# Patient Record
Sex: Male | Born: 1985 | Race: Black or African American | Hispanic: No | Marital: Single | Smoking: Never smoker
Health system: Southern US, Community
[De-identification: ages and names within clinical notes are randomized; demographics above are authoritative.]

## PROBLEM LIST (undated history)

## (undated) DIAGNOSIS — Z8619 Personal history of other infectious and parasitic diseases: Secondary | ICD-10-CM

## (undated) HISTORY — PX: KNEE ARTHROSCOPY: SUR90

## (undated) HISTORY — DX: Personal history of other infectious and parasitic diseases: Z86.19

---

## 2004-10-23 HISTORY — PX: KNEE SURGERY: SHX244

## 2008-02-17 ENCOUNTER — Emergency Department (HOSPITAL_COMMUNITY): Admission: EM | Admit: 2008-02-17 | Discharge: 2008-02-17 | Payer: Self-pay | Admitting: Emergency Medicine

## 2011-02-08 ENCOUNTER — Emergency Department: Payer: Self-pay | Admitting: Emergency Medicine

## 2012-07-17 IMAGING — CR DG CHEST 2V
1 series · 2 of 2 positions shown · non-contrast
Comparison: none

REASON FOR EXAM: CP
COMMENTS:

PROCEDURE:     DXR - DXR CHEST PA (OR AP) AND LATERAL  - February 08, 2011  [DATE]
RESULT:     The lungs are mildly hypoinflated. There is no focal infiltrate.
Interstitial markings are mildly increased. The cardiac silhouette is normal
in size. There is no pleural effusion.

[Series 1: view not recorded · 0.17mm/px · 2 of 2 slices shown]
[im 1/2]
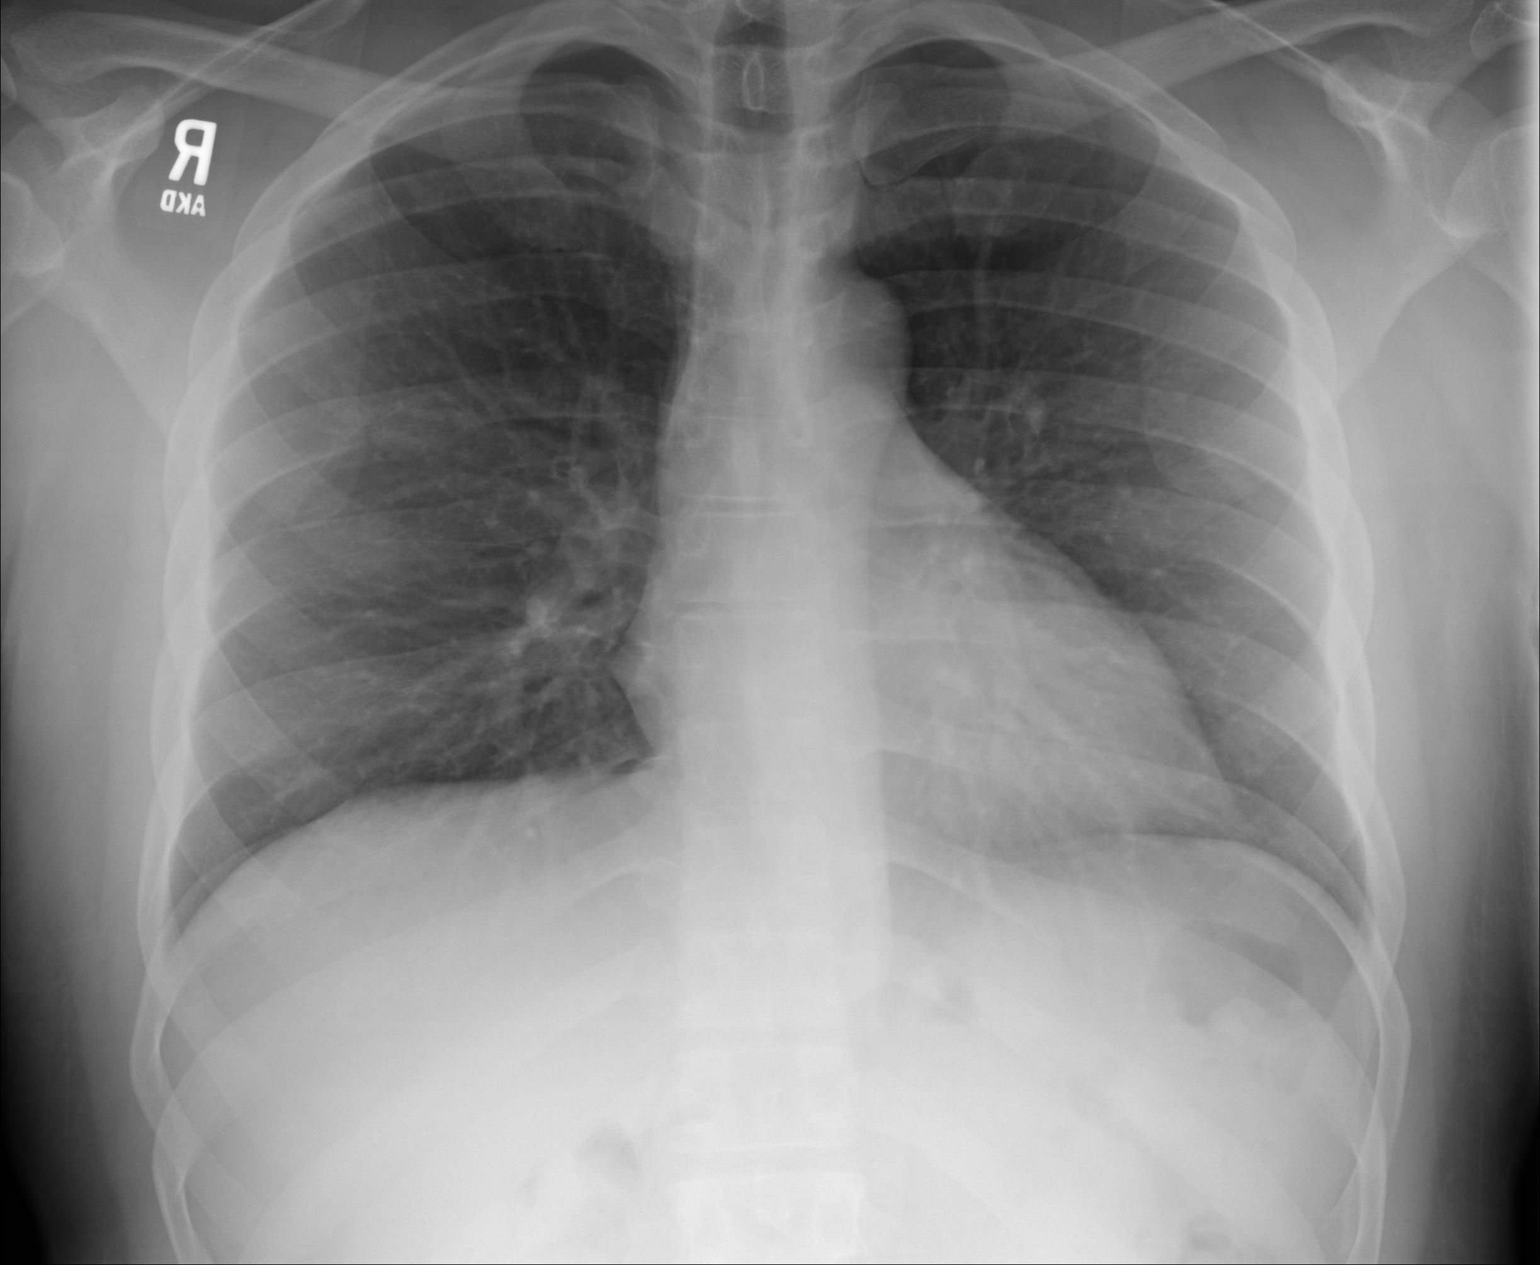
[im 2/2]
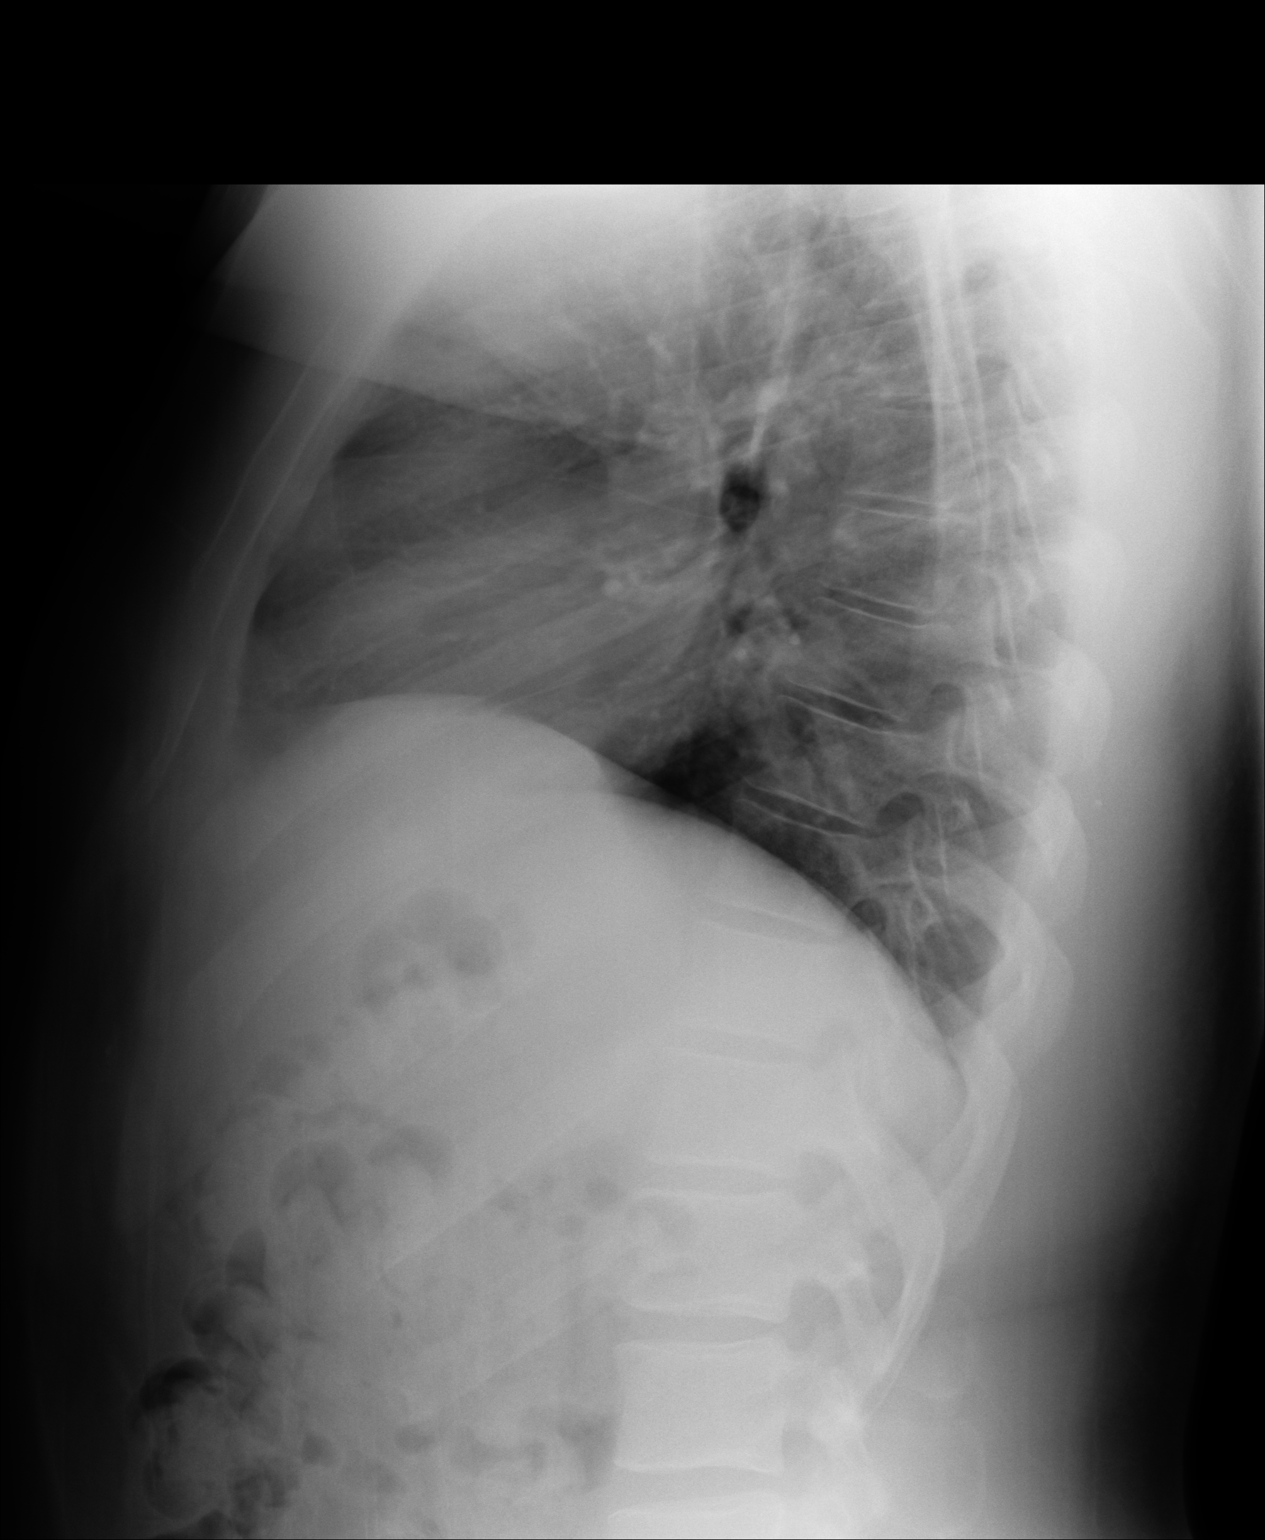

[2 of 2 positions shown; findings below may reference images not displayed]

IMPRESSION: The study is limited due to hypoinflation. Mild prominence
of the pulmonary interstitium is noted which could reflect minimal
subsegmental atelectasis or interstitial edema. I see no alveolar pneumonia.
Followup films following any therapy are recommended if the patient's
symptoms persist.

## 2014-01-26 ENCOUNTER — Emergency Department: Payer: Self-pay | Admitting: Emergency Medicine

## 2015-02-25 ENCOUNTER — Ambulatory Visit
Admission: EM | Admit: 2015-02-25 | Discharge: 2015-02-25 | Disposition: A | Discharge status: Home / Self Care | Payer: BLUE CROSS/BLUE SHIELD | Attending: Internal Medicine | Admitting: Internal Medicine

## 2015-02-25 DIAGNOSIS — H1012 Acute atopic conjunctivitis, left eye: Secondary | ICD-10-CM | POA: Diagnosis not present

## 2015-02-25 DIAGNOSIS — J019 Acute sinusitis, unspecified: Secondary | ICD-10-CM

## 2015-02-25 MED ORDER — PREDNISONE 50 MG PO TABS: 50.0000 mg | ORAL_TABLET | Freq: Every day | ORAL | Status: AC

## 2015-02-25 MED ORDER — CEFDINIR 300 MG PO CAPS: 300.0000 mg | ORAL_CAPSULE | Freq: Two times a day (BID) | ORAL | Status: DC

## 2015-02-25 MED ORDER — SULFACETAMIDE SODIUM 10 % OP SOLN: 1.0000 [drp] | OPHTHALMIC | Status: AC

## 2015-02-25 NOTE — ED Notes (Signed)
Woke this am with left eye crusted shut and itching. + redness. Visusal Accuity Right eye 20/40 and Left 20/50

## 2015-02-25 NOTE — ED Provider Notes (Signed)
CSN: [id number]     Arrival date & time 02/25/15  0755 History   None    Chief Complaint  Patient presents with  . Conjunctivitis    HPI  Patient presents today with "pink eye." He was fine yesterday, but has been having difficulty with nasal congestion, drainage, which he attributes to "allergies." He says that his allergies are typically severe.  This morning, he woke up with a pink, itchy eye, it's a little uncomfortable, but no pain. Vision is intermittently a little blurry. Scant crusting this morning.  He denies cough, sore throat, malaise. No fever. No sick contacts.   History reviewed. No pertinent past medical history.patient reports past medical history of severe environmental allergies  Past Surgical History  Procedure Laterality Date  . Knee arthroscopy Right    patient reports a history of knee surgery No family history on file. History  Substance Use Topics  . Smoking status: Never Smoker   . Smokeless tobacco: Not on file  . Alcohol Use: 0.6 oz/week    1 Shots of liquor per week     Comment: every 2 weeks    Review of Systems  All other systems reviewed and are negative.   Allergies  Review of patient's allergies indicates no known allergies.  Home Medications patient's home medications include Zyrtec-D.    Prior to Admission medications   Medication Sig Start Date End Date Taking? Authorizing Provider  cetirizine-pseudoephedrine (ZYRTEC-D) 5-120 MG per tablet Take 1 tablet by mouth 2 (two) times daily.   Yes Historical Provider, MD  cefdinir (OMNICEF) 300 MG capsule Take 1 capsule (300 mg total) by mouth 2 (two) times daily. 02/25/15   Eustace MooreLaura W Maurilio Puryear, MD  predniSONE (DELTASONE) 50 MG tablet Take 1 tablet (50 mg total) by mouth daily. 02/25/15 02/27/15  Eustace MooreLaura W Joaquina Nissen, MD  sulfacetamide (BLEPH-10) 10 % ophthalmic solution Place 1-2 drops into the left eye every 4 (four) hours. 02/25/15 03/04/15  Eustace MooreLaura W Reign Dziuba, MD   BP 135/87 mmHg  Pulse 93  Temp(Src) 96.5 F (35.8  C) (Tympanic)  Resp 16  Ht 6\' 1"  (1.854 m)  Wt 267 lb (121.11 kg)  BMI 35.23 kg/m2  SpO2 98% Physical Exam  Constitutional: He is oriented to person, place, and time. He appears well-developed and well-nourished. No distress.  HENT:  Head: Atraumatic.  Eyes: Left eye exhibits chemosis. Left conjunctiva is injected.  Neck: Neck supple.  Cardiovascular: Normal rate.   Pulmonary/Chest: Effort normal. No respiratory distress.  Abdominal: He exhibits no distension.  Musculoskeletal: Normal range of motion.  Neurological: He is alert and oriented to person, place, and time.  Skin: Skin is warm and dry.  Nursing note and vitals reviewed.  lungs are clear, no wheezing, no crackles. Cardiac rhythm is regular.  left upper and lower eyelids are a little puffy, with faint erythema below the left eye. Scant mucousy discharge in the inner corner of the eye. No corneal irregularity is appreciated on penlight exam.  Marked nasal congestion, with occlusion on the left, and mucopurulent material present. Throat is slightly red with postnasal discharge evident.  Bilateral tympanic membranes are dull, without erythema.   ED Course  Procedures (including critical care time) Labs Review Labs Reviewed - No data to display  Imaging Review No results found.   MDM   1. Conjunctivitis, allergic, left   2. Acute sinusitis, recurrence not specified, unspecified location        Eustace MooreLaura W Amirah Goerke, MD 02/25/15 [phone]

## 2017-08-27 ENCOUNTER — Ambulatory Visit (INDEPENDENT_AMBULATORY_CARE_PROVIDER_SITE_OTHER): Payer: BLUE CROSS/BLUE SHIELD | Admitting: Internal Medicine

## 2017-08-27 ENCOUNTER — Encounter: Payer: Self-pay | Admitting: Internal Medicine

## 2017-08-27 VITALS — BP 126/80 | HR 67 | Temp 97.9°F | Ht 72.0 in | Wt 290.0 lb

## 2017-08-27 DIAGNOSIS — Z0001 Encounter for general adult medical examination with abnormal findings: Secondary | ICD-10-CM

## 2017-08-27 DIAGNOSIS — Z0289 Encounter for other administrative examinations: Secondary | ICD-10-CM

## 2017-08-27 DIAGNOSIS — Z0000 Encounter for general adult medical examination without abnormal findings: Secondary | ICD-10-CM

## 2017-08-27 LAB — COMPREHENSIVE METABOLIC PANEL
ALBUMIN: 4.3 g/dL (ref 3.5–5.2)
ALT: 25 U/L (ref 0–53)
AST: 19 U/L (ref 0–37)
Alkaline Phosphatase: 43 U/L (ref 39–117)
BILIRUBIN TOTAL: 0.6 mg/dL (ref 0.2–1.2)
BUN: 12 mg/dL (ref 6–23)
CO2: 30 mEq/L (ref 19–32)
Calcium: 9.5 mg/dL (ref 8.4–10.5)
Chloride: 103 mEq/L (ref 96–112)
Creatinine, Ser: 1.16 mg/dL (ref 0.40–1.50)
GFR: 93.06 mL/min (ref >60.00)
Glucose, Bld: 92 mg/dL (ref 70–99)
Potassium: 4.1 mEq/L (ref 3.5–5.1)
Sodium: 138 mEq/L (ref 135–145)
TOTAL PROTEIN: 7.7 g/dL (ref 6.0–8.3)

## 2017-08-27 LAB — LIPID PANEL
Cholesterol: 165 mg/dL (ref 0–200)
HDL: 47.3 mg/dL (ref >39.00)
LDL Cholesterol: 105 mg/dL — ABNORMAL HIGH (ref 0–99)
NonHDL: 117.94
Total CHOL/HDL Ratio: 3
Triglycerides: 63 mg/dL (ref 0.0–149.0)
VLDL: 12.6 mg/dL (ref 0.0–40.0)

## 2017-08-27 LAB — CBC
HCT: 40.8 % (ref 39.0–52.0)
Hemoglobin: 13.1 g/dL (ref 13.0–17.0)
MCHC: 32.1 g/dL (ref 30.0–36.0)
MCV: 80.9 fl (ref 78.0–100.0)
Platelets: 264 10*3/uL (ref 150.0–400.0)
RBC: 5.04 Mil/uL (ref 4.22–5.81)
RDW: 14.2 % (ref 11.5–15.5)
WBC: 5.5 10*3/uL (ref 4.0–10.5)

## 2017-08-27 LAB — HEMOGLOBIN A1C: HEMOGLOBIN A1C: 5.9 % (ref 4.6–6.5)

## 2017-08-27 NOTE — Progress Notes (Signed)
HPI  Pt presents to the clinic today to establish care. He has not had a PCP in many years. He would like his annual exam today. He also has a DMV Medical form (not DOT physical) that he needs completed today.  Flu: never Tetanus: > 10 years Dentist: as needed  Diet: He does eat meat. He consume some fruits and veggies. He does eat fried food. He drinks water, juice, sweet tea. Exercise: None  Past Medical History:  Diagnosis Date  . History of chicken pox     No current outpatient medications on file.   No current facility-administered medications for this visit.     No Known Allergies  Family History  Problem Relation Age of Onset  . Heart attack Maternal Uncle     Social History   Socioeconomic History  . Marital status: Single    Spouse name: Not on file  . Number of children: Not on file  . Years of education: Not on file  . Highest education level: Not on file  Social Needs  . Financial resource strain: Not on file  . Food insecurity - worry: Not on file  . Food insecurity - inability: Not on file  . Transportation needs - medical: Not on file  . Transportation needs - non-medical: Not on file  Occupational History  . Not on file  Tobacco Use  . Smoking status: Never Smoker  . Smokeless tobacco: Never Used  Substance and Sexual Activity  . Alcohol use: Yes    Alcohol/week: 0.6 oz    Types: 1 Shots of liquor per week    Comment: occasional  . Drug use: No  . Sexual activity: Not on file  Other Topics Concern  . Not on file  Social History Narrative  . Not on file    ROS:  Constitutional: Denies fever, malaise, fatigue, headache or abrupt weight changes.  HEENT: Denies eye pain, eye redness, ear pain, ringing in the ears, wax buildup, runny nose, nasal congestion, bloody nose, or sore throat. Respiratory: Denies difficulty breathing, shortness of breath, cough or sputum production.   Cardiovascular: Denies chest pain, chest tightness, palpitations  or swelling in the hands or feet.  Gastrointestinal: Denies abdominal pain, bloating, constipation, diarrhea or blood in the stool.  GU: Denies frequency, urgency, pain with urination, blood in urine, odor or discharge. Musculoskeletal: Denies decrease in range of motion, difficulty with gait, muscle pain or joint pain and swelling.  Skin: Denies redness, rashes, lesions or ulcercations.  Neurological: Denies dizziness, difficulty with memory, difficulty with speech or problems with balance and coordination.  Psych: Denies anxiety, depression, SI/HI.  No other specific complaints in a complete review of systems (except as listed in HPI above).  PE: BP 126/80   Pulse 67   Temp 97.9 F (36.6 C) (Oral)   Ht 6' (1.829 m)   Wt 290 lb (131.5 kg)   SpO2 97%   BMI 39.33 kg/m   Wt Readings from Last 3 Encounters:  08/27/17 290 lb (131.5 kg)  02/25/15 267 lb (121.1 kg)    General: Appears his stated age, obese in NAD. HEENT: Head: normal shape and size; Eyes: sclera white, no icterus, conjunctiva pink, PERRLA and EOMs intact; Ears: Tm's gray and intact, normal light reflex; Throat/Mouth: Teeth present, mucosa pink and moist, no lesions or ulcerations noted.  Neck: Neck supple, trachea midline. No masses, lumps or thyromegaly present.  Cardiovascular: Normal rate and rhythm. S1,S2 noted.  No murmur, rubs or gallops noted.  Pulmonary/Chest: Normal effort and positive vesicular breath sounds. No respiratory distress. No wheezes, rales or ronchi noted.  Abdomen: Soft and nontender. Normal bowel sounds, no bruits noted. No distention or masses noted. Liver, spleen and kidneys non palpable. Musculoskeletal: Strength 5/5 BUE/BLE. No difficulty with gait.  Neurological: Alert and oriented. Cranial nerves II-XII grossly intact. Coordination normal.  Psychiatric: Mood and affect normal. Behavior is normal. Judgment and thought content normal.    Assessment and Plan:  Preventative Health  Maintenance:  He declines flu or tetanus booster today Encouraged him to consume a balanced diet and exercise regimen Advised him to see a dentist annually Will check CBC, CMET, Lipid and A1C today He declines STD screening  Encounter for Form Completion:  Form filled out, original given back to patient  RTC in 1 year, sooner if needed Nicki Reaper, NP

## 2017-08-27 NOTE — Patient Instructions (Signed)

## 2017-10-29 ENCOUNTER — Telehealth: Payer: Self-pay | Admitting: General Practice

## 2017-10-29 NOTE — Telephone Encounter (Signed)
Form was completed on 11/5. He should not need anything else at this time.

## 2017-10-29 NOTE — Telephone Encounter (Signed)
Best number [phone] Pt dropped off [place] [employer] form to be filled out.  He stated the form needed to be completed In [facility] in box

## 2017-10-29 NOTE — Telephone Encounter (Signed)
Pt aware paperwork is here to pick up and turn in

## 2019-06-07 ENCOUNTER — Other Ambulatory Visit: Payer: Self-pay

## 2019-06-07 DIAGNOSIS — R6889 Other general symptoms and signs: Secondary | ICD-10-CM | POA: Diagnosis not present

## 2019-06-07 DIAGNOSIS — Z20822 Contact with and (suspected) exposure to covid-19: Secondary | ICD-10-CM

## 2019-06-08 LAB — NOVEL CORONAVIRUS, NAA: SARS-CoV-2, NAA: NOT DETECTED
